# Patient Record
Sex: Female | Born: 2000
Health system: Southern US, Community
[De-identification: ages and names within clinical notes are randomized; demographics above are authoritative.]

---
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
## 2019-02-14 ENCOUNTER — Other Ambulatory Visit: Payer: Self-pay

## 2019-02-14 ENCOUNTER — Encounter: Payer: Self-pay | Admitting: Emergency Medicine

## 2019-02-14 ENCOUNTER — Emergency Department
Admission: EM | Admit: 2019-02-14 | Discharge: 2019-02-14 | Disposition: A | Discharge status: Home / Self Care | Payer: Managed Care, Other (non HMO) | Attending: Emergency Medicine | Admitting: Emergency Medicine

## 2019-02-14 ENCOUNTER — Emergency Department: Payer: Managed Care, Other (non HMO)

## 2019-02-14 DIAGNOSIS — Y92481 Parking lot as the place of occurrence of the external cause: Secondary | ICD-10-CM | POA: Insufficient documentation

## 2019-02-14 DIAGNOSIS — Y9389 Activity, other specified: Secondary | ICD-10-CM | POA: Insufficient documentation

## 2019-02-14 DIAGNOSIS — W2210XA Striking against or struck by unspecified automobile airbag, initial encounter: Secondary | ICD-10-CM | POA: Insufficient documentation

## 2019-02-14 DIAGNOSIS — M25531 Pain in right wrist: Secondary | ICD-10-CM | POA: Diagnosis not present

## 2019-02-14 DIAGNOSIS — Y998 Other external cause status: Secondary | ICD-10-CM | POA: Diagnosis not present

## 2019-02-14 MED ORDER — MELOXICAM 15 MG PO TABS: 15.0000 mg | ORAL_TABLET | Freq: Every day | ORAL | 1 refills | Status: AC

## 2019-02-14 MED ORDER — CYCLOBENZAPRINE HCL 10 MG PO TABS: 10.0000 mg | ORAL_TABLET | Freq: Three times a day (TID) | ORAL | 0 refills | Status: AC | PRN
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
## 2020-06-29 DIAGNOSIS — N390 Urinary tract infection, site not specified: Secondary | ICD-10-CM | POA: Diagnosis not present

## 2020-06-29 DIAGNOSIS — A499 Bacterial infection, unspecified: Secondary | ICD-10-CM | POA: Diagnosis not present

## 2020-07-19 ENCOUNTER — Encounter: Payer: Self-pay | Admitting: Emergency Medicine

## 2020-07-19 ENCOUNTER — Emergency Department
Admission: EM | Admit: 2020-07-19 | Discharge: 2020-07-19 | Disposition: A | Discharge status: Home / Self Care | Payer: 59 | Attending: Emergency Medicine | Admitting: Emergency Medicine

## 2020-07-19 ENCOUNTER — Other Ambulatory Visit: Payer: Self-pay

## 2020-07-19 DIAGNOSIS — R1110 Vomiting, unspecified: Secondary | ICD-10-CM | POA: Insufficient documentation

## 2020-07-19 DIAGNOSIS — R1111 Vomiting without nausea: Secondary | ICD-10-CM

## 2020-07-19 DIAGNOSIS — Z20822 Contact with and (suspected) exposure to covid-19: Secondary | ICD-10-CM | POA: Diagnosis not present

## 2020-07-19 LAB — CBC
HCT: 31.9 % — ABNORMAL LOW (ref 36.0–46.0)
Hemoglobin: 9.9 g/dL — ABNORMAL LOW (ref 12.0–15.0)
MCH: 22.3 pg — ABNORMAL LOW (ref 26.0–34.0)
MCHC: 31 g/dL (ref 30.0–36.0)
MCV: 72 fL — ABNORMAL LOW (ref 80.0–100.0)
Platelets: 340 10*3/uL (ref 150–400)
RBC: 4.43 MIL/uL (ref 3.87–5.11)
RDW: 15.8 % — ABNORMAL HIGH (ref 11.5–15.5)
WBC: 14.8 10*3/uL — ABNORMAL HIGH (ref 4.0–10.5)
nRBC: 0 % (ref 0.0–0.2)

## 2020-07-19 LAB — URINALYSIS, COMPLETE (UACMP) WITH MICROSCOPIC
Bilirubin Urine: NEGATIVE
Glucose, UA: NEGATIVE mg/dL
Hgb urine dipstick: NEGATIVE
Ketones, ur: NEGATIVE mg/dL
Nitrite: NEGATIVE
Protein, ur: 30 mg/dL — AB
Specific Gravity, Urine: 1.038 — ABNORMAL HIGH (ref 1.005–1.030)
pH: 5 (ref 5.0–8.0)

## 2020-07-19 LAB — COMPREHENSIVE METABOLIC PANEL
ALT: 11 U/L (ref 0–44)
AST: 15 U/L (ref 15–41)
Albumin: 3.7 g/dL (ref 3.5–5.0)
Alkaline Phosphatase: 72 U/L (ref 38–126)
Anion gap: 10 (ref 5–15)
BUN: 15 mg/dL (ref 6–20)
CO2: 23 mmol/L (ref 22–32)
Calcium: 8.8 mg/dL — ABNORMAL LOW (ref 8.9–10.3)
Chloride: 103 mmol/L (ref 98–111)
Creatinine, Ser: 0.82 mg/dL (ref 0.44–1.00)
GFR, Estimated: >60 mL/min (ref >60)
Glucose, Bld: 84 mg/dL (ref 70–99)
Potassium: 4 mmol/L (ref 3.5–5.1)
Sodium: 136 mmol/L (ref 135–145)
Total Bilirubin: 0.4 mg/dL (ref 0.3–1.2)
Total Protein: 8 g/dL (ref 6.5–8.1)

## 2020-07-19 LAB — RESPIRATORY PANEL BY RT PCR (FLU A&B, COVID)
Influenza A by PCR: NEGATIVE
Influenza B by PCR: NEGATIVE
SARS Coronavirus 2 by RT PCR: NEGATIVE

## 2020-07-19 LAB — PREGNANCY, URINE: Preg Test, Ur: NEGATIVE

## 2020-07-19 LAB — LIPASE, BLOOD: Lipase: 26 U/L (ref 11–51)

## 2020-07-19 NOTE — ED Triage Notes (Signed)
Pt comes into the ED via POV c/o emesis x 1 episode.  Pt denies any abdominal pain or diarrhea.  Pt in NAD at this time with even and unlabored respirations.  Pt states she hasn't had much appetite today but denies any other problems and denies "feeling bad".

## 2020-07-19 NOTE — ED Notes (Signed)
Pt tolerated po challenge well, ate crackers and drank soda, no c/o nausea or vomiting. Pt states she feels better

## 2020-07-19 NOTE — ED Notes (Signed)
Urine sample present in lab

## 2020-07-19 NOTE — ED Provider Notes (Signed)
[facility] Emergency Department Provider Note   ____________________________________________   First MD Initiated Contact with Patient 07/19/20 1623     (approximate)  I have reviewed the triage vital signs and the nursing notes.   HISTORY  Chief Complaint Emesis    HPI Tina Hardin is a 19 y.o. female with no stated past medical history who presents for an episode of emesis that occurred approximately 2 hours prior to arrival.  Patient denies this having any coffee grounds or anything looks like bright red blood.  Patient denies any acute pain.  Patient denies any other complaints at this time.  Patient denies any recent travel, food of urinary, sick contacts.  Patient is vaccinated for Covid and denies any recent exposures.  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  Patient has not tried any p.o. intake since this episode of vomiting stating that she just "does not have an appetite"         History reviewed. No pertinent past medical history.  There are no problems to display for this patient.   History reviewed. No pertinent surgical history.  Prior to Admission medications   Not on File    Allergies Patient has no known allergies.  History reviewed. No pertinent family history.  Social History Social History   Tobacco Use  . Smoking status: Never Smoker  . Smokeless tobacco: Never Used  Substance Use Topics  . Alcohol use: Not Currently  . Drug use: Never    Review of Systems Constitutional: No fever/chills Eyes: No visual changes. ENT: No sore throat. Cardiovascular: Denies chest pain. Respiratory: Denies shortness of breath. Gastrointestinal: No abdominal pain.  Endorses nausea, no vomiting.  No diarrhea. Genitourinary: Negative for dysuria. Musculoskeletal: Negative for acute arthralgias Skin: Negative for rash. Neurological: Negative for headaches,  weakness/numbness/paresthesias in any extremity Psychiatric: Negative for suicidal ideation/homicidal ideation   ____________________________________________   PHYSICAL EXAM:  VITAL SIGNS: ED Triage Vitals [07/19/20 1556]  Enc Vitals Group     BP (!) 151/95     Pulse Rate 84     Resp 16     Temp 99.2 F (37.3 C)     Temp Source Oral     SpO2 100 %     Weight 234 lb (106.1 kg)     Height 5\' 2"  (1.575 m)     Head Circumference      Peak Flow      Pain Score 0     Pain Loc      Pain Edu?      Excl. in GC?    Constitutional: Alert and oriented. Well appearing and in no acute distress. Eyes: Conjunctivae are normal. PERRL. Head: Atraumatic. Nose: No congestion/rhinnorhea. Mouth/Throat: Mucous membranes are moist. Neck: No stridor Cardiovascular: Grossly normal heart sounds.  Good peripheral circulation. Respiratory: Normal respiratory effort.  No retractions. Gastrointestinal: Soft and nontender. No distention. Musculoskeletal: No obvious deformities Neurologic:  Normal speech and language. No gross focal neurologic deficits are appreciated. Skin:  Skin is warm and dry. No rash noted. Psychiatric: Mood and affect are normal. Speech and behavior are normal.  ____________________________________________   LABS (all labs ordered are listed, but only abnormal results are displayed)  Labs Reviewed  COMPREHENSIVE METABOLIC PANEL - Abnormal; Notable for the following components:      Result Value   Calcium 8.8 (*)    All other components within normal limits  CBC - Abnormal;  Notable for the following components:   WBC 14.8 (*)    Hemoglobin 9.9 (*)    HCT 31.9 (*)    MCV 72.0 (*)    MCH 22.3 (*)    RDW 15.8 (*)    All other components within normal limits  URINALYSIS, COMPLETE (UACMP) WITH MICROSCOPIC - Abnormal; Notable for the following components:   Color, Urine YELLOW (*)    APPearance HAZY (*)    Specific Gravity, Urine 1.038 (*)    Protein, ur 30 (*)     Leukocytes,Ua MODERATE (*)    Bacteria, UA RARE (*)    All other components within normal limits  RESPIRATORY PANEL BY RT PCR (FLU A&B, COVID)  LIPASE, BLOOD  PREGNANCY, URINE     PROCEDURES  Procedure(s) performed (including Critical Care):  Procedures   ____________________________________________   INITIAL IMPRESSION / ASSESSMENT AND PLAN / ED COURSE  As part of my medical decision making, I reviewed the following data within the electronic MEDICAL RECORD NUMBER Nursing notes reviewed and incorporated, Labs reviewed, EKG interpreted, Old chart reviewed, Radiograph reviewed and Notes from prior ED visits reviewed and incorporated        Patient is a 19 year old female with no stated past medical history the presents for 1 episode of emesis without any associated nausea or abdominal pain.  The cause of the patients symptoms is not clear, but the patient is overall well appearing and is suspected to have a transient course of illness.  Given History and Exam there does not appear to be an emergent cause of the symptoms such as small bowel obstruction, coronary syndrome, bowel ischemia, DKA, pancreatitis, appendicitis, other acute abdomen or other emergent problem. Patient has tested negative for Covid Reassessment: After treatment, the patient is feeling much better, tolerating PO fluids, and shows no signs of dehydration.   Disposition: Discharge home with prompt primary care physician follow up in the next 48 hours. Strict return precautions discussed.      ____________________________________________   FINAL CLINICAL IMPRESSION(S) / ED DIAGNOSES  Final diagnoses:  Non-intractable vomiting without nausea, unspecified vomiting type     ED Discharge Orders    None       Note:  This document was prepared using Dragon voice recognition software and may include unintentional dictation errors.   Merwyn Katos, MD 07/19/20 [phone]

## 2020-07-19 NOTE — ED Notes (Signed)
Pt given crackers and soda for po challenge. Pt states she feels hungry and does not feel nauseated at this time

## 2020-07-20 ENCOUNTER — Encounter (HOSPITAL_COMMUNITY): Payer: Self-pay

## 2020-07-20 ENCOUNTER — Ambulatory Visit (HOSPITAL_COMMUNITY)
Admission: EM | Admit: 2020-07-20 | Discharge: 2020-07-20 | Disposition: A | Discharge status: Home / Self Care | Payer: 59 | Attending: Family Medicine | Admitting: Family Medicine

## 2020-07-20 ENCOUNTER — Other Ambulatory Visit: Payer: Self-pay

## 2020-07-20 DIAGNOSIS — J029 Acute pharyngitis, unspecified: Secondary | ICD-10-CM

## 2020-07-20 DIAGNOSIS — R0602 Shortness of breath: Secondary | ICD-10-CM

## 2020-07-20 DIAGNOSIS — R059 Cough, unspecified: Secondary | ICD-10-CM | POA: Diagnosis present

## 2020-07-20 LAB — POCT RAPID STREP A, ED / UC: Streptococcus, Group A Screen (Direct): NEGATIVE

## 2020-07-20 MED ORDER — IBUPROFEN 600 MG PO TABS: 600.0000 mg | ORAL_TABLET | Freq: Three times a day (TID) | ORAL | 0 refills | Status: DC | PRN

## 2020-07-20 MED ORDER — AMOXICILLIN-POT CLAVULANATE 875-125 MG PO TABS: 1.0000 | ORAL_TABLET | Freq: Two times a day (BID) | ORAL | 0 refills | Status: DC

## 2020-07-20 MED ORDER — DEXAMETHASONE SODIUM PHOSPHATE 10 MG/ML IJ SOLN
INTRAMUSCULAR | Status: AC
  Filled 2020-07-20: qty 1

## 2020-07-20 MED ORDER — DEXAMETHASONE SODIUM PHOSPHATE 10 MG/ML IJ SOLN
10.0000 mg | Freq: Once | INTRAMUSCULAR | Status: AC
  Administered 2020-07-20: 10 mg

## 2020-07-20 MED ORDER — DEXAMETHASONE 1 MG/ML PO CONC: 10.0000 mg | Freq: Once | ORAL | Status: DC

## 2020-07-20 NOTE — Discharge Instructions (Addendum)
Steroid given here today Augmentin twice a day for next 7 days to treat infection.  600 mg of ibuprofen every 8 hours as needed Over-the-counter medicines as needed for symptoms Follow up as needed for continued or worsening symptoms

## 2020-07-20 NOTE — ED Provider Notes (Signed)
[facility]    CSN: [id number] Arrival date & time: 07/20/20  3295      History   Chief Complaint Chief Complaint  Patient presents with  . Shortness of Breath  . Sore Throat    HPI Tina Hardin is a 19 y.o. female.   Patient is a 19 year old female presents today with sore throat for the past 3 days, productive cough with white and green sputum, emesis, shortness of breath and chest pain onset yesterday.  Was evaluated in the ER last night and found to have negative Covid, flu and RSV screen.  Does like the sore throat is getting worse.  Denies any trouble swallowing or breathing.  Denies any diarrhea, fever, loss of taste or smell.  Has been taking DayQuil without improvement of symptoms.     History reviewed. No pertinent past medical history.  There are no problems to display for this patient.   History reviewed. No pertinent surgical history.  OB History   No obstetric history on file.      Home Medications    Prior to Admission medications   Medication Sig Start Date End Date Taking? Authorizing Provider  norgestimate-ethinyl estradiol (ESTARYLLA) 0.25-35 MG-MCG tablet Take 1 tablet by mouth daily. 05/14/20  Yes [provider]  amoxicillin-clavulanate (AUGMENTIN) 875-125 MG tablet Take 1 tablet by mouth every 12 (twelve) hours. 07/20/20   Dahlia Byes A, NP  ibuprofen (ADVIL) 600 MG tablet Take 1 tablet (600 mg total) by mouth every 8 (eight) hours as needed for moderate pain. 07/20/20   Janace Aris, NP    Family History Family History  Problem Relation Age of Onset  . Hypertension Mother   . Hypertension Father     Social History Social History   Tobacco Use  . Smoking status: Never Smoker  . Smokeless tobacco: Never Used  Substance Use Topics  . Alcohol use: Not Currently  . Drug use: Never     Allergies   Patient has no known allergies.   Review of Systems Review of Systems   Physical Exam Triage Vital  Signs ED Triage Vitals  Enc Vitals Group     BP 07/20/20 0922 (!) 153/103     Pulse Rate 07/20/20 0922 94     Resp 07/20/20 0922 19     Temp 07/20/20 0922 98.7 F (37.1 C)     Temp Source 07/20/20 0922 Oral     SpO2 07/20/20 0922 100 %     Weight --      Height --      Head Circumference --      Peak Flow --      Pain Score 07/20/20 0916 8     Pain Loc --      Pain Edu? --      Excl. in GC? --    No data found.  Updated Vital Signs BP (!) 153/103 (BP Location: Right Wrist)   Pulse 94   Temp 98.7 F (37.1 C) (Oral)   Resp 19   LMP 07/08/2020   SpO2 100%   Visual Acuity Right Eye Distance:   Left Eye Distance:   Bilateral Distance:    Right Eye Near:   Left Eye Near:    Bilateral Near:     Physical Exam Vitals and nursing note reviewed.  Constitutional:      General: She is not in acute distress.    Appearance: Normal appearance. She is not ill-appearing, toxic-appearing or diaphoretic.  HENT:  Head: Normocephalic.     Nose: Nose normal.     Mouth/Throat:     Pharynx: Oropharynx is clear. Uvula midline. Posterior oropharyngeal erythema present.     Tonsils: Tonsillar exudate present. 2+ on the right. 2+ on the left.  Eyes:     Conjunctiva/sclera: Conjunctivae normal.  Cardiovascular:     Rate and Rhythm: Normal rate and regular rhythm.  Pulmonary:     Effort: Pulmonary effort is normal.     Breath sounds: Normal breath sounds.  Musculoskeletal:        General: Normal range of motion.     Cervical back: Normal range of motion.  Skin:    General: Skin is warm and dry.     Findings: No rash.  Neurological:     Mental Status: She is alert.  Psychiatric:        Mood and Affect: Mood normal.      UC Treatments / Results  Labs (all labs ordered are listed, but only abnormal results are displayed) Labs Reviewed  CULTURE, GROUP A STREP [facility])  POCT RAPID STREP A, ED / UC    EKG   Radiology No results found.  Procedures Procedures  (including critical care time)  Medications Ordered in UC Medications  dexamethasone (DECADRON) injection 10 mg (10 mg Other Given 07/20/20 1101)    Initial Impression / Assessment and Plan / UC Course  I have reviewed the triage vital signs and the nursing notes.  Pertinent labs & imaging results that were available during my care of the patient were reviewed by me and considered in my medical decision making (see chart for details).     Sore throat and cough Treating for possible strep based on exam, symptoms, elevated WBC count  Negative strep test here but sending for culture. Patient had elevated white blood cell count of 14 in the ER yesterday. Negative Covid screening, flu and RSV Decadron given here for tonsillar swelling, pain inflammation. Prescribing Augmentin twice a day for 7 days Recommend ibuprofen at home as needed and over-the-counter medicines as needed. Follow up as needed for continued or worsening symptoms   Final Clinical Impressions(s) / UC Diagnoses   Final diagnoses:  Sore throat  Cough     Discharge Instructions     Steroid given here today Augmentin twice a day for next 7 days to treat infection.  600 mg of ibuprofen every 8 hours as needed Over-the-counter medicines as needed for symptoms Follow up as needed for continued or worsening symptoms      ED Prescriptions    Medication Sig Dispense Auth. Provider   amoxicillin-clavulanate (AUGMENTIN) 875-125 MG tablet Take 1 tablet by mouth every 12 (twelve) hours. 14 tablet Madolin Twaddle A, NP   ibuprofen (ADVIL) 600 MG tablet Take 1 tablet (600 mg total) by mouth every 8 (eight) hours as needed for moderate pain. 30 tablet Dahlia Byes A, NP     PDMP not reviewed this encounter.   Janace Aris, NP 07/20/20 1138

## 2020-07-20 NOTE — ED Triage Notes (Signed)
Pt c/o sore throat for past three days, productive cough with white and green sputum, emesis, SOB,  onset yesterday. Was evaluated in ER last night. Here today b/c pt states her breathing and "chest congestion" is worse. Had negative COVID test yesterday.   Denies diarrhea, fever, CP, loss of taste/smell. Has been taking dayquil w/o improvement to symptoms.  No emesis today. Wheezes bilaterally. Able to speak sentences w/o difficulty. Tonsils enlarged with small amount exudate on right tonsil.

## 2020-07-22 LAB — CULTURE, GROUP A STREP (THRC)

## 2020-08-11 ENCOUNTER — Encounter (HOSPITAL_COMMUNITY): Payer: Self-pay | Admitting: *Deleted

## 2020-08-11 ENCOUNTER — Ambulatory Visit (HOSPITAL_COMMUNITY)
Admission: EM | Admit: 2020-08-11 | Discharge: 2020-08-11 | Disposition: A | Discharge status: Home / Self Care | Payer: 59 | Attending: Emergency Medicine | Admitting: Emergency Medicine

## 2020-08-11 DIAGNOSIS — N9089 Other specified noninflammatory disorders of vulva and perineum: Secondary | ICD-10-CM

## 2020-08-11 DIAGNOSIS — T304 Corrosion of unspecified body region, unspecified degree: Secondary | ICD-10-CM

## 2020-08-11 MED ORDER — MUPIROCIN 2 % EX OINT: 1.0000 "application " | TOPICAL_OINTMENT | Freq: Two times a day (BID) | CUTANEOUS | 0 refills | Status: DC

## 2020-08-11 MED ORDER — IBUPROFEN 800 MG PO TABS: 800.0000 mg | ORAL_TABLET | Freq: Three times a day (TID) | ORAL | 0 refills | Status: DC

## 2020-08-11 MED ORDER — SILVER SULFADIAZINE 1 % EX CREA: 1.0000 "application " | TOPICAL_CREAM | Freq: Every day | CUTANEOUS | 0 refills | Status: DC
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
## 2020-08-11 NOTE — Discharge Instructions (Signed)
Keep area clean and dry May Silvadene daily to help with healing Bactroban twice daily to help prevent infection Tylenol and ibuprofen for pain Follow-up if not improving or worsening, developing any concerns for infection
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
## 2020-08-16 DIAGNOSIS — Z1159 Encounter for screening for other viral diseases: Secondary | ICD-10-CM | POA: Diagnosis not present

## 2020-08-16 DIAGNOSIS — D539 Nutritional anemia, unspecified: Secondary | ICD-10-CM | POA: Diagnosis not present

## 2020-08-16 DIAGNOSIS — R0602 Shortness of breath: Secondary | ICD-10-CM | POA: Diagnosis not present

## 2020-08-16 DIAGNOSIS — Z1339 Encounter for screening examination for other mental health and behavioral disorders: Secondary | ICD-10-CM | POA: Diagnosis not present

## 2020-08-16 DIAGNOSIS — Z0000 Encounter for general adult medical examination without abnormal findings: Secondary | ICD-10-CM | POA: Diagnosis not present

## 2020-08-16 DIAGNOSIS — R5383 Other fatigue: Secondary | ICD-10-CM | POA: Diagnosis not present

## 2020-08-16 DIAGNOSIS — E559 Vitamin D deficiency, unspecified: Secondary | ICD-10-CM | POA: Diagnosis not present

## 2020-08-22 DIAGNOSIS — E538 Deficiency of other specified B group vitamins: Secondary | ICD-10-CM | POA: Diagnosis not present

## 2020-08-22 DIAGNOSIS — D539 Nutritional anemia, unspecified: Secondary | ICD-10-CM | POA: Diagnosis not present

## 2020-08-22 DIAGNOSIS — E559 Vitamin D deficiency, unspecified: Secondary | ICD-10-CM | POA: Diagnosis not present
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
## 2022-10-24 ENCOUNTER — Ambulatory Visit
Admission: EM | Admit: 2022-10-24 | Discharge: 2022-10-24 | Disposition: A | Discharge status: Home / Self Care | Payer: BC Managed Care – PPO | Attending: Nurse Practitioner | Admitting: Nurse Practitioner

## 2022-10-24 DIAGNOSIS — J029 Acute pharyngitis, unspecified: Secondary | ICD-10-CM | POA: Insufficient documentation

## 2022-10-24 DIAGNOSIS — B349 Viral infection, unspecified: Secondary | ICD-10-CM | POA: Diagnosis present

## 2022-10-24 DIAGNOSIS — Z1152 Encounter for screening for COVID-19: Secondary | ICD-10-CM | POA: Diagnosis not present

## 2022-10-24 NOTE — Discharge Instructions (Signed)
Your symptoms and exam are consistent for a viral illness. Please treat your symptoms with over the counter cough medication, tylenol or ibuprofen, humidifier, and rest. Viral illnesses can last 7-14 days. Please follow up with your PCP if your symptoms are not improving. Please go to the ER for any worsening symptoms. This includes but is not limited to fever you can not control with tylenol or ibuprofen, you are not able to stay hydrated, you have shortness of breath or chest pain.  Thank you for choosing  for your healthcare needs. I hope you feel better soon!  

## 2022-10-24 NOTE — ED Triage Notes (Signed)
Pt c/o sore throat x2 days.  Home interventions: none

## 2022-10-24 NOTE — ED Provider Notes (Signed)
[facility]    CSN: [id number] Arrival date & time: 10/24/22  1414      History   Chief Complaint Chief Complaint  Patient presents with   Sore Throat    HPI Tina Hardin is a 22 y.o. female who presents for evaluation of URI symptoms for 2 days. Patient reports associated symptoms of sore throat and headache. Denies N/V/D, cough, congestion, body aches, fevers, ear pain, shortness of breath. Patient does not have a hx of asthma or smoking. No known sick contacts and no recent travel. Pt is vaccinated for COVID. Pt is vaccinated for flu this season. Pt has taken nothing OTC for symptoms. Pt has no other concerns at this time.    Sore Throat Associated symptoms include headaches.    History reviewed. No pertinent past medical history.  There are no problems to display for this patient.   History reviewed. No pertinent surgical history.  OB History   No obstetric history on file.      Home Medications    Prior to Admission medications   Medication Sig Start Date End Date Taking? Authorizing Provider  ibuprofen (ADVIL) 800 MG tablet Take 1 tablet (800 mg total) by mouth 3 (three) times daily. 08/11/20   Wieters, Hallie C, PA-C  mupirocin ointment (BACTROBAN) 2 % Apply 1 application topically 2 (two) times daily. 08/11/20   Wieters, Hallie C, PA-C  norgestimate-ethinyl estradiol (ESTARYLLA) 0.25-35 MG-MCG tablet Take 1 tablet by mouth daily. 05/14/20   [provider]  silver sulfADIAZINE (SILVADENE) 1 % cream Apply 1 application topically daily. 08/11/20   Wieters, Elesa Hacker, PA-C    Family History Family History  Problem Relation Age of Onset   Hypertension Mother    Hypertension Father     Social History Social History   Tobacco Use   Smoking status: Never   Smokeless tobacco: Never  Vaping Use   Vaping Use: Never used  Substance Use Topics   Alcohol use: Yes    Comment: occasionally   Drug use: Never     Allergies   Patient  has no known allergies.   Review of Systems Review of Systems  HENT:  Positive for sore throat.   Neurological:  Positive for headaches.     Physical Exam Triage Vital Signs ED Triage Vitals  Enc Vitals Group     BP 10/24/22 1453 138/87     Pulse Rate 10/24/22 1453 100     Resp 10/24/22 1453 18     Temp 10/24/22 1453 98.9 F (37.2 C)     Temp Source 10/24/22 1453 Oral     SpO2 10/24/22 1453 97 %     Weight --      Height --      Head Circumference --      Peak Flow --      Pain Score 10/24/22 1452 8     Pain Loc --      Pain Edu? --      Excl. in [place]? --    No data found.  Updated Vital Signs BP 138/87 (BP Location: Left Arm)   Pulse 100   Temp 98.9 F (37.2 C) (Oral)   Resp 18   LMP 10/12/2022   SpO2 97%   Visual Acuity Right Eye Distance:   Left Eye Distance:   Bilateral Distance:    Right Eye Near:   Left Eye Near:    Bilateral Near:     Physical Exam Vitals and nursing  note reviewed.  Constitutional:      General: She is not in acute distress.    Appearance: She is well-developed. She is not ill-appearing.  HENT:     Head: Normocephalic and atraumatic.     Right Ear: Tympanic membrane and ear canal normal.     Left Ear: Tympanic membrane and ear canal normal.     Nose: No congestion or rhinorrhea.     Mouth/Throat:     Mouth: Mucous membranes are moist.     Pharynx: Oropharynx is clear. Uvula midline. Posterior oropharyngeal erythema present.     Tonsils: No tonsillar exudate or tonsillar abscesses.  Eyes:     Conjunctiva/sclera: Conjunctivae normal.     Pupils: Pupils are equal, round, and reactive to light.  Cardiovascular:     Rate and Rhythm: Normal rate and regular rhythm.     Heart sounds: Normal heart sounds.  Pulmonary:     Effort: Pulmonary effort is normal.     Breath sounds: Normal breath sounds.  Musculoskeletal:     Cervical back: Normal range of motion and neck supple.  Lymphadenopathy:     Cervical: No cervical adenopathy.   Skin:    General: Skin is warm and dry.  Neurological:     General: No focal deficit present.     Mental Status: She is alert and oriented to person, place, and time.  Psychiatric:        Mood and Affect: Mood normal.        Behavior: Behavior normal.      UC Treatments / Results  Labs (all labs ordered are listed, but only abnormal results are displayed) Labs Reviewed  SARS CORONAVIRUS 2 (TAT 6-24 HRS)  CULTURE, GROUP A STREP [facility])    EKG   Radiology No results found.  Procedures Procedures (including critical care time)  Medications Ordered in UC Medications - No data to display  Initial Impression / Assessment and Plan / UC Course  I have reviewed the triage vital signs and the nursing notes.  Pertinent labs & imaging results that were available during my care of the patient were reviewed by me and considered in my medical decision making (see chart for details).     Reviewed exam and symptoms with patient.  No red flags on exam. Rapid strep negative will culture COVID PCR and will contact if positive Rest and fluids Salt water gargles and warm liquids Over-the-counter analgesics as needed Follow-up with PCP 2 to 3 days for recheck ER precautions reviewed and patient verbalized understanding Final Clinical Impressions(s) / UC Diagnoses   Final diagnoses:  Sore throat  Viral illness     Discharge Instructions      Your symptoms and exam are consistent for a viral illness. Please treat your symptoms with over the counter cough medication, tylenol or ibuprofen, humidifier, and rest. Viral illnesses can last 7-14 days. Please follow up with your PCP if your symptoms are not improving. Please go to the ER for any worsening symptoms. This includes but is not limited to fever you can not control with tylenol or ibuprofen, you are not able to stay hydrated, you have shortness of breath or chest pain.  Thank you for choosing [facility] for your healthcare  needs. I hope you feel better soon!     ED Prescriptions   None    PDMP not reviewed this encounter.   Melynda Ripple, NP 10/24/22 1520

## 2022-10-25 LAB — SARS CORONAVIRUS 2 (TAT 6-24 HRS): SARS Coronavirus 2: NEGATIVE

## 2022-10-27 LAB — CULTURE, GROUP A STREP (THRC)

## 2023-02-25 ENCOUNTER — Ambulatory Visit
Admission: EM | Admit: 2023-02-25 | Discharge: 2023-02-25 | Disposition: A | Discharge status: Home / Self Care | Payer: BC Managed Care – PPO | Attending: Urgent Care | Admitting: Urgent Care

## 2023-02-25 DIAGNOSIS — M5431 Sciatica, right side: Secondary | ICD-10-CM | POA: Diagnosis not present

## 2023-02-25 DIAGNOSIS — M25551 Pain in right hip: Secondary | ICD-10-CM | POA: Diagnosis not present

## 2023-02-25 MED ORDER — PREDNISONE 20 MG PO TABS: ORAL_TABLET | ORAL | 0 refills | Status: AC

## 2023-02-25 NOTE — ED Triage Notes (Signed)
Pt reports hip pain x 5 days. Pain is worse when she lay down. Reports she was doing a lot of traveling when pain started and was sitting for along time in the plane. Advil gives no relief.

## 2023-02-25 NOTE — ED Provider Notes (Signed)
[facility]  Note:  This document was prepared using Conservation officer, historic buildings and may include unintentional dictation errors.  MRN: [id number] DOB: October 21, 2000  Subjective:   Tina Hardin is a 22 y.o. female presenting for 5 day history of acute onset internal right hip pain. She recently was traveling, had to do a lot of flights and thinks this may have been related.  She has also had intermittent low back/buttock pains that radiate to the right posterior leg. Symptoms occur randomly and are aggravated by her work. 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 No fall, trauma, numbness or tingling, saddle paresthesia, changes to bowel or urinary habits. Hydrates with water more when she is at work. No history of musculoskeletal disorders. Has used ibuprofen without relief.   No current facility-administered medications for this encounter.  Current Outpatient Medications:    ibuprofen (ADVIL) 800 MG tablet, Take 1 tablet (800 mg total) by mouth 3 (three) times daily., Disp: 21 tablet, Rfl: 0   mupirocin ointment (BACTROBAN) 2 %, Apply 1 application topically 2 (two) times daily., Disp: 30 g, Rfl: 0   norgestimate-ethinyl estradiol (ESTARYLLA) 0.25-35 MG-MCG tablet, Take 1 tablet by mouth daily., Disp: , Rfl:    silver sulfADIAZINE (SILVADENE) 1 % cream, Apply 1 application topically daily., Disp: 50 g, Rfl: 0   No Known Allergies  History reviewed. No pertinent past medical history.   History reviewed. No pertinent surgical history.  Family History  Problem Relation Age of Onset   Hypertension Mother    Hypertension Father     Social History   Tobacco Use   Smoking status: Never   Smokeless tobacco: Never  Vaping Use   Vaping Use: Never used  Substance Use Topics   Alcohol use: Yes    Comment: occasionally   Drug use: Never    ROS   Objective:   Vitals: BP (!) 141/94 (BP Location: Right Arm)   Pulse 90    Temp 99.4 F (37.4 C) (Oral)   Resp 16   LMP 02/11/2023 (Exact Date)   SpO2 98%   Physical Exam Constitutional:      General: She is not in acute distress.    Appearance: Normal appearance. She is well-developed. She is not ill-appearing, toxic-appearing or diaphoretic.  HENT:     Head: Normocephalic and atraumatic.     Nose: Nose normal.     Mouth/Throat:     Mouth: Mucous membranes are moist.  Eyes:     General: No scleral icterus.       Right eye: No discharge.        Left eye: No discharge.     Extraocular Movements: Extraocular movements intact.  Cardiovascular:     Rate and Rhythm: Normal rate.  Pulmonary:     Effort: Pulmonary effort is normal.  Musculoskeletal:     Lumbar back: Tenderness (over area outlined) present. No swelling, edema, deformity, signs of trauma, lacerations, spasms or bony tenderness. Normal range of motion. Negative right straight leg raise test and negative left straight leg raise test. No scoliosis.       Back:     Right hip: Tenderness (by report only, not elicited on exam) present. No deformity, lacerations, bony tenderness or crepitus. Normal range of motion.  Skin:    General: Skin is warm and dry.  Neurological:     General: No focal deficit present.     Mental Status:  She is alert and oriented to person, place, and time.  Psychiatric:        Mood and Affect: Mood normal.        Behavior: Behavior normal.     Assessment and Plan :   PDMP not reviewed this encounter.  1. Right hip pain   2. Sciatica, right side     Offered imaging but patient politely declines today. Suspect inflammatory pain related to the nature of her work, aggravated by her recent traveling. Has failed ibuprofen and therefore will use prednisone for 5 days. Follow up with an orthopedist or with [place] if she would like to reconsider x-ray imaging.  Counseled patient on potential for adverse effects with medications prescribed/recommended today, ER and  return-to-clinic precautions discussed, patient verbalized understanding.    Wallis Bamberg, [place] 02/25/23 1348

## 2024-08-22 ENCOUNTER — Ambulatory Visit (LOCAL_COMMUNITY_HEALTH_CENTER)

## 2024-08-22 VITALS — Wt 278.0 lb

## 2024-08-22 DIAGNOSIS — R7611 Nonspecific reaction to tuberculin skin test without active tuberculosis: Secondary | ICD-10-CM

## 2024-08-22 LAB — HM HIV SCREENING LAB: HM HIV Screening: NEGATIVE

## 2024-08-22 NOTE — Progress Notes (Unsigned)
 The patient is a X32 yo female screened for TB with PPD skin test which was positive, referred from [facility].  EPI completed today 08/22/2024. The patient has no signs or symptoms of TB.  Explained active vs latent TB.  All questions answered.  No known medical history, no current meds. Denies OTC meds.   Education provided regarding LTBI treatment without contraception use.    Consulted with Dr. Herlene, patient with low risk exposures and recent negative PPD in Oct 2025, Dr. Herlene requested QFT before preceding with CXR.  QFT and HIV drawn today.  Will follow up with CXR when QFT results are back.

## 2024-08-25 LAB — QUANTIFERON-TB GOLD PLUS
QuantiFERON Mitogen Value: >10 [IU]/mL
QuantiFERON Nil Value: 0.01 [IU]/mL
QuantiFERON TB1 Ag Value: 0.01 [IU]/mL
QuantiFERON TB2 Ag Value: 0.01 [IU]/mL
QuantiFERON-TB Gold Plus: NEGATIVE

## 2024-08-29 ENCOUNTER — Ambulatory Visit: Payer: Self-pay
